# Patient Record
Sex: Female | Born: 2000 | Race: White | Hispanic: No | Marital: Single | Smoking: Never smoker
Health system: Southern US, Community
[De-identification: ages and names within clinical notes are randomized; demographics above are authoritative.]

## PROBLEM LIST (undated history)

## (undated) DIAGNOSIS — K2970 Gastritis, unspecified, without bleeding: Secondary | ICD-10-CM

## (undated) HISTORY — PX: ADENOIDECTOMY: SUR15

## (undated) HISTORY — PX: UPPER GASTROINTESTINAL ENDOSCOPY: SHX188

## (undated) HISTORY — PX: WISDOM TOOTH EXTRACTION: SHX21

## (undated) HISTORY — PX: TONSILLECTOMY: SUR1361

---
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
## 2020-06-09 NOTE — L&D Delivery Note (Signed)
Delivery Note Patient was admitted yesterday with early labor and new onset gestational hypertension.  She progressed without augmentation to 8 cm.  She developed chorioamnionitis and was started on ampicillin and gentamicin. She had a protracted active phase overnight.  This morning, an IUPC was placed and pitocin started.  She then progressed to 10 cm.  She pushed well for 1.5 hours.  At 12:51 PM a viable female was delivered via Vaginal, Spontaneous (Presentation: Right Occiput Anterior).  APGAR: 7, 8; weight 3270 gm (7lb 3.3oz) .   Placenta status: Spontaneous, Intact.  Cord: 3 vessels with the following complications: None.  Cord pH: n/a  Anesthesia: Epidural Episiotomy: None Lacerations: Vaginal, bilateral.  Both extended slightly to labia majora Suture Repair: 2.0 vicryl rapide Est. Blood Loss (mL): 100  Mom to postpartum.  Baby to Couplet care / Skin to Skin.  Enid Maultsby GEFFEL Charline Hoskinson 03/08/2021, 1:38 PM

## 2020-08-08 LAB — OB RESULTS CONSOLE GC/CHLAMYDIA
Chlamydia: NEGATIVE
Gonorrhea: NEGATIVE

## 2020-08-08 LAB — OB RESULTS CONSOLE ABO/RH: RH Type: POSITIVE

## 2020-08-08 LAB — OB RESULTS CONSOLE RPR: RPR: NONREACTIVE

## 2020-08-08 LAB — OB RESULTS CONSOLE RUBELLA ANTIBODY, IGM: Rubella: NON-IMMUNE/NOT IMMUNE

## 2020-08-08 LAB — OB RESULTS CONSOLE HIV ANTIBODY (ROUTINE TESTING): HIV: NONREACTIVE

## 2020-08-08 LAB — OB RESULTS CONSOLE ANTIBODY SCREEN: Antibody Screen: NEGATIVE

## 2020-08-08 LAB — OB RESULTS CONSOLE HEPATITIS B SURFACE ANTIGEN: Hepatitis B Surface Ag: NEGATIVE

## 2020-08-16 ENCOUNTER — Other Ambulatory Visit: Payer: Self-pay

## 2020-09-06 ENCOUNTER — Other Ambulatory Visit: Payer: Self-pay | Admitting: Obstetrics and Gynecology

## 2020-09-06 DIAGNOSIS — Z363 Encounter for antenatal screening for malformations: Secondary | ICD-10-CM

## 2020-10-03 ENCOUNTER — Encounter: Payer: Self-pay | Admitting: *Deleted

## 2020-10-08 ENCOUNTER — Other Ambulatory Visit: Payer: Self-pay

## 2020-10-08 ENCOUNTER — Ambulatory Visit: Payer: BC Managed Care – PPO | Attending: Obstetrics and Gynecology

## 2020-10-08 DIAGNOSIS — K2970 Gastritis, unspecified, without bleeding: Secondary | ICD-10-CM

## 2020-10-08 DIAGNOSIS — O99342 Other mental disorders complicating pregnancy, second trimester: Secondary | ICD-10-CM | POA: Diagnosis not present

## 2020-10-08 DIAGNOSIS — F419 Anxiety disorder, unspecified: Secondary | ICD-10-CM | POA: Diagnosis not present

## 2020-10-08 DIAGNOSIS — O99612 Diseases of the digestive system complicating pregnancy, second trimester: Secondary | ICD-10-CM

## 2020-10-08 DIAGNOSIS — Z363 Encounter for antenatal screening for malformations: Secondary | ICD-10-CM | POA: Diagnosis present

## 2020-10-08 DIAGNOSIS — O321XX0 Maternal care for breech presentation, not applicable or unspecified: Secondary | ICD-10-CM

## 2020-10-08 DIAGNOSIS — Z3A19 19 weeks gestation of pregnancy: Secondary | ICD-10-CM

## 2021-02-21 ENCOUNTER — Telehealth (HOSPITAL_COMMUNITY): Payer: Self-pay | Admitting: *Deleted

## 2021-02-21 NOTE — Telephone Encounter (Signed)
Preadmission screen  

## 2021-02-22 ENCOUNTER — Encounter (HOSPITAL_COMMUNITY): Payer: Self-pay | Admitting: *Deleted

## 2021-02-22 ENCOUNTER — Telehealth (HOSPITAL_COMMUNITY): Payer: Self-pay | Admitting: *Deleted

## 2021-02-22 NOTE — Telephone Encounter (Signed)
Preadmission screen  

## 2021-03-05 ENCOUNTER — Other Ambulatory Visit: Payer: Self-pay | Admitting: Obstetrics and Gynecology

## 2021-03-06 LAB — SARS CORONAVIRUS 2 (TAT 6-24 HRS): SARS Coronavirus 2: NEGATIVE

## 2021-03-07 ENCOUNTER — Inpatient Hospital Stay (HOSPITAL_COMMUNITY): Payer: Medicaid Other

## 2021-03-07 ENCOUNTER — Encounter (HOSPITAL_COMMUNITY): Payer: Self-pay | Admitting: Obstetrics and Gynecology

## 2021-03-07 ENCOUNTER — Inpatient Hospital Stay (HOSPITAL_COMMUNITY)
Admission: AD | Admit: 2021-03-07 | Payer: Medicaid Other | Source: Home / Self Care | Admitting: Obstetrics and Gynecology

## 2021-03-07 ENCOUNTER — Inpatient Hospital Stay (HOSPITAL_COMMUNITY): Payer: Medicaid Other | Admitting: Anesthesiology

## 2021-03-07 ENCOUNTER — Inpatient Hospital Stay (HOSPITAL_COMMUNITY)
Admission: AD | Admit: 2021-03-07 | Discharge: 2021-03-10 | DRG: 805 | Disposition: A | Discharge status: Home / Self Care | Payer: Medicaid Other | Attending: Obstetrics | Admitting: Obstetrics

## 2021-03-07 ENCOUNTER — Other Ambulatory Visit: Payer: Self-pay

## 2021-03-07 DIAGNOSIS — O411230 Chorioamnionitis, third trimester, not applicable or unspecified: Secondary | ICD-10-CM | POA: Diagnosis present

## 2021-03-07 DIAGNOSIS — O99892 Other specified diseases and conditions complicating childbirth: Secondary | ICD-10-CM | POA: Diagnosis present

## 2021-03-07 DIAGNOSIS — R000 Tachycardia, unspecified: Secondary | ICD-10-CM | POA: Diagnosis present

## 2021-03-07 DIAGNOSIS — Z3A40 40 weeks gestation of pregnancy: Secondary | ICD-10-CM | POA: Diagnosis not present

## 2021-03-07 DIAGNOSIS — O134 Gestational [pregnancy-induced] hypertension without significant proteinuria, complicating childbirth: Secondary | ICD-10-CM | POA: Diagnosis present

## 2021-03-07 HISTORY — DX: Gastritis, unspecified, without bleeding: K29.70

## 2021-03-07 LAB — CBC
HCT: 39.3 % (ref 36.0–46.0)
Hemoglobin: 13.7 g/dL (ref 12.0–15.0)
MCH: 29.5 pg (ref 26.0–34.0)
MCHC: 34.9 g/dL (ref 30.0–36.0)
MCV: 84.5 fL (ref 80.0–100.0)
Platelets: 207 10*3/uL (ref 150–400)
RBC: 4.65 MIL/uL (ref 3.87–5.11)
RDW: 12.8 % (ref 11.5–15.5)
WBC: 15.1 10*3/uL — ABNORMAL HIGH (ref 4.0–10.5)
nRBC: 0 % (ref 0.0–0.2)

## 2021-03-07 LAB — COMPREHENSIVE METABOLIC PANEL
ALT: 11 U/L (ref 0–44)
AST: 18 U/L (ref 15–41)
Albumin: 2.9 g/dL — ABNORMAL LOW (ref 3.5–5.0)
Alkaline Phosphatase: 116 U/L (ref 38–126)
Anion gap: 12 (ref 5–15)
BUN: 7 mg/dL (ref 6–20)
CO2: 16 mmol/L — ABNORMAL LOW (ref 22–32)
Calcium: 8.7 mg/dL — ABNORMAL LOW (ref 8.9–10.3)
Chloride: 108 mmol/L (ref 98–111)
Creatinine, Ser: 0.72 mg/dL (ref 0.44–1.00)
GFR, Estimated: >60 mL/min (ref >60)
Glucose, Bld: 98 mg/dL (ref 70–99)
Potassium: 3.6 mmol/L (ref 3.5–5.1)
Sodium: 136 mmol/L (ref 135–145)
Total Bilirubin: 0.9 mg/dL (ref 0.3–1.2)
Total Protein: 6 g/dL — ABNORMAL LOW (ref 6.5–8.1)

## 2021-03-07 LAB — TYPE AND SCREEN
ABO/RH(D): A POS
Antibody Screen: NEGATIVE

## 2021-03-07 LAB — PROTEIN / CREATININE RATIO, URINE
Creatinine, Urine: 117.8 mg/dL
Protein Creatinine Ratio: 0.12 mg/mg{Cre} (ref 0.00–0.15)
Total Protein, Urine: 14 mg/dL

## 2021-03-07 MED ORDER — LACTATED RINGERS IV SOLN: 500.0000 mL | INTRAVENOUS | Status: DC | PRN

## 2021-03-07 MED ORDER — PHENYLEPHRINE 40 MCG/ML (10ML) SYRINGE FOR IV PUSH (FOR BLOOD PRESSURE SUPPORT): 80.0000 ug | PREFILLED_SYRINGE | INTRAVENOUS | Status: DC | PRN

## 2021-03-07 MED ORDER — FENTANYL CITRATE (PF) 100 MCG/2ML IJ SOLN
50.0000 ug | INTRAMUSCULAR | Status: DC | PRN
  Administered 2021-03-07: 50 ug via INTRAVENOUS
  Filled 2021-03-07: qty 2

## 2021-03-07 MED ORDER — OXYTOCIN-SODIUM CHLORIDE 30-0.9 UT/500ML-% IV SOLN
2.5000 [IU]/h | INTRAVENOUS | Status: DC
  Administered 2021-03-08: 2.5 [IU]/h via INTRAVENOUS

## 2021-03-07 MED ORDER — OXYTOCIN BOLUS FROM INFUSION
333.0000 mL | Freq: Once | INTRAVENOUS | Status: AC
  Administered 2021-03-08: 333 mL via INTRAVENOUS

## 2021-03-07 MED ORDER — SOD CITRATE-CITRIC ACID 500-334 MG/5ML PO SOLN: 30.0000 mL | ORAL | Status: DC | PRN

## 2021-03-07 MED ORDER — LIDOCAINE HCL (PF) 1 % IJ SOLN: 30.0000 mL | INTRAMUSCULAR | Status: DC | PRN

## 2021-03-07 MED ORDER — LACTATED RINGERS IV SOLN: 500.0000 mL | Freq: Once | INTRAVENOUS | Status: DC

## 2021-03-07 MED ORDER — ACETAMINOPHEN 325 MG PO TABS
650.0000 mg | ORAL_TABLET | ORAL | Status: DC | PRN
  Administered 2021-03-08 (×3): 650 mg via ORAL
  Filled 2021-03-07 (×3): qty 2

## 2021-03-07 MED ORDER — OXYCODONE-ACETAMINOPHEN 5-325 MG PO TABS: 2.0000 | ORAL_TABLET | ORAL | Status: DC | PRN

## 2021-03-07 MED ORDER — DIPHENHYDRAMINE HCL 50 MG/ML IJ SOLN
12.5000 mg | INTRAMUSCULAR | Status: DC | PRN
Start: 2021-03-07 — End: 2021-03-08

## 2021-03-07 MED ORDER — LACTATED RINGERS IV SOLN: INTRAVENOUS | Status: DC

## 2021-03-07 MED ORDER — OXYCODONE-ACETAMINOPHEN 5-325 MG PO TABS: 1.0000 | ORAL_TABLET | ORAL | Status: DC | PRN

## 2021-03-07 MED ORDER — ONDANSETRON HCL 4 MG/2ML IJ SOLN
4.0000 mg | Freq: Four times a day (QID) | INTRAMUSCULAR | Status: DC | PRN
  Administered 2021-03-08: 4 mg via INTRAVENOUS
  Filled 2021-03-07: qty 2

## 2021-03-07 MED ORDER — TERBUTALINE SULFATE 1 MG/ML IJ SOLN: 0.2500 mg | Freq: Once | INTRAMUSCULAR | Status: DC | PRN

## 2021-03-07 MED ORDER — LIDOCAINE-EPINEPHRINE (PF) 2 %-1:200000 IJ SOLN
INTRAMUSCULAR | Status: DC | PRN
  Administered 2021-03-07: 5 mL via EPIDURAL

## 2021-03-07 MED ORDER — EPHEDRINE 5 MG/ML INJ: 10.0000 mg | INTRAVENOUS | Status: DC | PRN

## 2021-03-07 MED ORDER — OXYTOCIN-SODIUM CHLORIDE 30-0.9 UT/500ML-% IV SOLN
1.0000 m[IU]/min | INTRAVENOUS | Status: DC
  Administered 2021-03-08: 2 m[IU]/min via INTRAVENOUS
  Filled 2021-03-07: qty 500

## 2021-03-07 MED ORDER — FENTANYL-BUPIVACAINE-NACL 0.5-0.125-0.9 MG/250ML-% EP SOLN
12.0000 mL/h | EPIDURAL | Status: DC | PRN
  Administered 2021-03-07 – 2021-03-08 (×2): 12 mL/h via EPIDURAL
  Filled 2021-03-07 (×2): qty 250

## 2021-03-07 NOTE — H&P (Signed)
Katelyn Mcguire is a 20 y.o. female G1P0 [redacted]w[redacted]d, was scheduled for late term induction today however came into MAU with contractions, blood show, and elevated blood pressures earlier today. She reports good fetal movement and no leakage of fluid.   In MAU, exam 3/70/-2 (same as office) but Bps noted to elevated 136/96, 140/103/ 134/79, 133/92. She did have one elevated diastolic of 90 at around 25 weeks but otherwise has had normal blood pressures throughout the pregnancy. She denies headache, vision changes, RUQ pain.   Pregnancy has been otherwise uncomplicated.   OB History     Gravida  1   Para      Term      Preterm      AB      Living         SAB      IAB      Ectopic      Multiple      Live Births             Past Medical History:  Diagnosis Date   Gastritis    Past Surgical History:  Procedure Laterality Date   ADENOIDECTOMY     TONSILLECTOMY     UPPER GASTROINTESTINAL ENDOSCOPY     WISDOM TOOTH EXTRACTION     Family History: family history includes Breast cancer in her maternal grandmother. Social History:  reports that she has never smoked. She has never used smokeless tobacco. She reports that she does not drink alcohol and does not use drugs.     Maternal Diabetes: No Genetic Screening: Normal Maternal Ultrasounds/Referrals: Normal Fetal Ultrasounds or other Referrals:  None Maternal Substance Abuse:  No Significant Maternal Medications:  None Significant Maternal Lab Results:  Group B Strep negative Other Comments:  None  Review of Systems Per HPI Exam Physical Exam  Dilation: 4 Effacement (%): 80 Station: -2 Exam by:: Dr. Timothy Lasso Blood pressure 130/82, pulse 84, temperature 98.8 F (37.1 C), temperature source Axillary, resp. rate 18, height 5\' 4"  (1.626 m), weight 87.1 kg, last menstrual period 05/26/2020, SpO2 96 %. Gen: NAD, resting comfortably CVS: RRR Lungs; CTAB Abd:  Gravid abdomen, Leopolds 7# Ext: no calf edema or  tenderness  SVE 4/80/-2 Fetal testing: FHR 130bpm, moderate variability, + accels, no decels Ctx q 2-4 mins Prenatal labs: ABO, Rh:  --/--/A POS (09/29 1237) Antibody: NEG (09/29 1237) Rubella: Nonimmune (03/02 0000) RPR: Nonreactive (03/02 0000)  HBsAg: Negative (03/02 0000)  HIV: Non-reactive (03/02 0000)  GBS:   negative  Assessment/Plan: 20Y G1P0 @ [redacted]w[redacted]d, early labor, new diagnosis gestational hypertension Fetal wellbeing: cat I tracing Early labor: currently progressing on her own from 3 to 4cm with regular contractions. Discussed augmentation if contractions space with pitocin and/or AROM. Expectant management for now Gestational hypertension: new diagnosis by mild range BPS, normal plts and CMP, urine P/C 0.12. No signs/symptoms of severe preeclampsia.  Pain control: epidural upon patient request  [redacted]w[redacted]d 03/07/2021, 4:08 PM

## 2021-03-07 NOTE — Anesthesia Preprocedure Evaluation (Addendum)
Anesthesia Evaluation  Patient identified by MRN, date of birth, ID band Patient awake    Reviewed: Allergy & Precautions, NPO status , Patient's Chart, lab work & pertinent test results  Airway Mallampati: II  TM Distance: >3 FB Neck ROM: Full    Dental no notable dental hx.    Pulmonary neg pulmonary ROS,    Pulmonary exam normal breath sounds clear to auscultation       Cardiovascular hypertension (gHTN), Normal cardiovascular exam Rhythm:Regular Rate:Normal     Neuro/Psych negative neurological ROS  negative psych ROS   GI/Hepatic negative GI ROS, Neg liver ROS,   Endo/Other  negative endocrine ROS  Renal/GU negative Renal ROS  negative genitourinary   Musculoskeletal negative musculoskeletal ROS (+)   Abdominal (+) + obese,   Peds  Hematology negative hematology ROS (+)   Anesthesia Other Findings Presented in labor  Reproductive/Obstetrics (+) Pregnancy                            Anesthesia Physical Anesthesia Plan  ASA: 3  Anesthesia Plan: Epidural   Post-op Pain Management:    Induction:   PONV Risk Score and Plan: Treatment may vary due to age or medical condition  Airway Management Planned: Natural Airway  Additional Equipment:   Intra-op Plan:   Post-operative Plan:   Informed Consent: I have reviewed the patients History and Physical, chart, labs and discussed the procedure including the risks, benefits and alternatives for the proposed anesthesia with the patient or authorized representative who has indicated his/her understanding and acceptance.       Plan Discussed with: Anesthesiologist  Anesthesia Plan Comments: (Patient identified. Risks, benefits, options discussed with patient including but not limited to bleeding, infection, nerve damage, paralysis, failed block, incomplete pain control, headache, blood pressure changes, nausea, vomiting, reactions to  medication, itching, and post partum back pain. Confirmed with bedside nurse the patient's most recent platelet count. Confirmed with the patient that they are not taking any anticoagulation, have any bleeding history or any family history of bleeding disorders. Patient expressed understanding and wishes to proceed. All questions were answered. )        Anesthesia Quick Evaluation

## 2021-03-07 NOTE — Anesthesia Procedure Notes (Signed)
Epidural Patient location during procedure: OB Start time: 03/07/2021 4:45 PM End time: 03/07/2021 4:55 PM  Staffing Anesthesiologist: Elmer Picker, MD Performed: anesthesiologist   Preanesthetic Checklist Completed: patient identified, IV checked, risks and benefits discussed, monitors and equipment checked, pre-op evaluation and timeout performed  Epidural Patient position: sitting Prep: DuraPrep and site prepped and draped Patient monitoring: continuous pulse ox, blood pressure, heart rate and cardiac monitor Approach: midline Location: L3-L4 Injection technique: LOR air  Needle:  Needle type: Tuohy  Needle gauge: 17 G Needle length: 9 cm Needle insertion depth: 5 cm Catheter type: closed end flexible Catheter size: 19 Gauge Catheter at skin depth: 10 cm Test dose: negative  Assessment Sensory level: T8 Events: blood not aspirated, injection not painful, no injection resistance, no paresthesia and negative IV test  Additional Notes Patient identified. Risks/Benefits/Options discussed with patient including but not limited to bleeding, infection, nerve damage, paralysis, failed block, incomplete pain control, headache, blood pressure changes, nausea, vomiting, reactions to medication both or allergic, itching and postpartum back pain. Confirmed with bedside nurse the patient's most recent platelet count. Confirmed with patient that they are not currently taking any anticoagulation, have any bleeding history or any family history of bleeding disorders. Patient expressed understanding and wished to proceed. All questions were answered. Sterile technique was used throughout the entire procedure. Please see nursing notes for vital signs. Test dose was given through epidural catheter and negative prior to continuing to dose epidural or start infusion. Warning signs of high block given to the patient including shortness of breath, tingling/numbness in hands, complete motor block,  or any concerning symptoms with instructions to call for help. Patient was given instructions on fall risk and not to get out of bed. All questions and concerns addressed with instructions to call with any issues or inadequate analgesia.  Reason for block:procedure for pain

## 2021-03-07 NOTE — Progress Notes (Signed)
OB Progress Note  S: patient comfortable with epidural   O: Today's Vitals   03/07/21 1806 03/07/21 1811 03/07/21 1831 03/07/21 1902  BP: 125/83 133/88 132/85 101/66  Pulse: (!) 107 (!) 118 98 (!) 112  Resp: 18 18 18 18   Temp:    98.7 F (37.1 C)  TempSrc:      SpO2:      Weight:      Height:      PainSc:    0-No pain   Body mass index is 32.96 kg/m.  SVE 5/80/-1, AROM blood tinged fluid  FHR: 135bpm, moderate variability,  + accels,no decels Toco: ctx q 2-3 min   A/P: 20Y G1P0 @ [redacted]w[redacted]d, early labor, new diagnosis gestational hypertension Fetal wellbeing: cat I tracing Early labor: s/p AROM, progressing well, continue expectant management.  Gestational hypertension: new diagnosis by mild range BPS, normal plts and CMP, urine P/C 0.12. No signs/symptoms of severe preeclampsia.  Pain control: epidural  M. [redacted]w[redacted]d, MD 03/07/21 7:51 PM

## 2021-03-07 NOTE — MAU Note (Signed)
.  Katelyn Mcguire is a 20 y.o. at [redacted]w[redacted]d here in MAU reporting: ctx that started around 0930 this morning. Reports she is having some bloody show. Denies LOF. Endorses good fetal movement. For induction today. GBS neg.  Pain score: 4

## 2021-03-08 ENCOUNTER — Encounter (HOSPITAL_COMMUNITY): Payer: Self-pay | Admitting: Obstetrics and Gynecology

## 2021-03-08 LAB — RPR: RPR Ser Ql: NONREACTIVE

## 2021-03-08 MED ORDER — IBUPROFEN 600 MG PO TABS
600.0000 mg | ORAL_TABLET | Freq: Four times a day (QID) | ORAL | Status: DC
  Administered 2021-03-08 – 2021-03-10 (×8): 600 mg via ORAL
  Filled 2021-03-08 (×8): qty 1

## 2021-03-08 MED ORDER — ONDANSETRON HCL 4 MG/2ML IJ SOLN: 4.0000 mg | INTRAMUSCULAR | Status: DC | PRN

## 2021-03-08 MED ORDER — ACETAMINOPHEN 325 MG PO TABS: 650.0000 mg | ORAL_TABLET | ORAL | Status: DC | PRN

## 2021-03-08 MED ORDER — OXYCODONE HCL 5 MG PO TABS: 5.0000 mg | ORAL_TABLET | ORAL | Status: DC | PRN

## 2021-03-08 MED ORDER — WITCH HAZEL-GLYCERIN EX PADS
1.0000 "application " | MEDICATED_PAD | CUTANEOUS | Status: DC | PRN
  Administered 2021-03-08: 1 via TOPICAL

## 2021-03-08 MED ORDER — SENNOSIDES-DOCUSATE SODIUM 8.6-50 MG PO TABS
2.0000 | ORAL_TABLET | ORAL | Status: DC
  Administered 2021-03-09 – 2021-03-10 (×2): 2 via ORAL
  Filled 2021-03-08 (×2): qty 2

## 2021-03-08 MED ORDER — DIPHENHYDRAMINE HCL 25 MG PO CAPS: 25.0000 mg | ORAL_CAPSULE | Freq: Four times a day (QID) | ORAL | Status: DC | PRN

## 2021-03-08 MED ORDER — SODIUM CHLORIDE 0.9 % IV SOLN
2.0000 g | Freq: Four times a day (QID) | INTRAVENOUS | Status: DC
  Administered 2021-03-08 (×2): 2 g via INTRAVENOUS
  Filled 2021-03-08 (×2): qty 2000

## 2021-03-08 MED ORDER — ONDANSETRON HCL 4 MG PO TABS: 4.0000 mg | ORAL_TABLET | ORAL | Status: DC | PRN

## 2021-03-08 MED ORDER — GENTAMICIN SULFATE 40 MG/ML IJ SOLN
5.0000 mg/kg | INTRAVENOUS | Status: DC
  Administered 2021-03-08: 440 mg via INTRAVENOUS
  Filled 2021-03-08 (×2): qty 11

## 2021-03-08 MED ORDER — TETANUS-DIPHTH-ACELL PERTUSSIS 5-2.5-18.5 LF-MCG/0.5 IM SUSY: 0.5000 mL | PREFILLED_SYRINGE | Freq: Once | INTRAMUSCULAR | Status: DC

## 2021-03-08 MED ORDER — PRENATAL MULTIVITAMIN CH
1.0000 | ORAL_TABLET | Freq: Every day | ORAL | Status: DC
  Administered 2021-03-09 – 2021-03-10 (×2): 1 via ORAL
  Filled 2021-03-08 (×2): qty 1

## 2021-03-08 MED ORDER — COCONUT OIL OIL: 1.0000 "application " | TOPICAL_OIL | Status: DC | PRN

## 2021-03-08 MED ORDER — SIMETHICONE 80 MG PO CHEW: 80.0000 mg | CHEWABLE_TABLET | ORAL | Status: DC | PRN

## 2021-03-08 MED ORDER — OXYCODONE HCL 5 MG PO TABS: 10.0000 mg | ORAL_TABLET | ORAL | Status: DC | PRN

## 2021-03-08 MED ORDER — DIBUCAINE (PERIANAL) 1 % EX OINT: 1.0000 "application " | TOPICAL_OINTMENT | CUTANEOUS | Status: DC | PRN

## 2021-03-08 MED ORDER — BENZOCAINE-MENTHOL 20-0.5 % EX AERO
1.0000 "application " | INHALATION_SPRAY | CUTANEOUS | Status: DC | PRN
  Administered 2021-03-08 – 2021-03-10 (×2): 1 via TOPICAL
  Filled 2021-03-08 (×2): qty 56

## 2021-03-08 NOTE — Progress Notes (Signed)
OB Progress Note  Notified by RN of T 100.40F Temp at 0157 100.34F after Tylenol given for headache   O: Today's Vitals   03/08/21 0231 03/08/21 0301 03/08/21 0329 03/08/21 0332  BP: (!) 136/98 (!) 137/93  120/75  Pulse: (!) 118 (!) 116  (!) 122  Resp:      Temp:   (!) 100.9 F (38.3 C)   TempSrc:   Axillary   SpO2:      Weight:      Height:      PainSc:       Body mass index is 32.96 kg/m.  Last exam by RN at 0151: 8/90/0  FHR: 155bpm, minimal variability, absent accels, early decels Toco: ctx q 2 mins   A/P: A/P:  20Y G1P0 @ [redacted]w[redacted]d, labor, new diagnosis gestational hypertension Fetal wellbeing: cat 2 tracing, continue position change PRN and IV fluids Active labor: expectant management Gestational hypertension: new diagnosis by mild range BPS, normal plts and CMP, urine P/C 0.12. No signs/symptoms of severe preeclampsia.  Pain control: epidural Febrile, with maternal tachycardia: given last temp was 100.34F after taking Tylenol, will treat as chorioamnionitis now and start amp/gent.   Alinda Deem, MD 03/08/21 3:39 AM

## 2021-03-08 NOTE — Lactation Note (Addendum)
This note was copied from a baby's chart. Lactation Consultation Note  Patient Name: Katelyn Mcguire FIEPP'I Date: 03/08/2021 Reason for consult: Initial assessment;Mother's request;Difficult latch;Primapara;1st time breastfeeding;Term Age: 22 hrs  Mom struggling trying to latch infant throughout the day. LC worked on suck training to bring his tongue down and flange out his lips. Infant latched with increase in depth of swallows after suck training.   Plan 1. To feed based on cues 8-12x 24 hr period. Mom to offer breasts and look for signs of milk transfer.  2. If unable to latch, Mom to hand express and give colostrum on spoon.  3. Mom wearing breast shells not pumping, sleeping or nursing. (Areola edema) 4. Mom manual pump post pump after latching 10 min each breast. 5 I and O sheet reviewed.  6. LC brochure of inpatient and outpatient services reviewed.   Mom stated 24 flange better fit. Mom aware nipples can swell and will alert the RN if pain or discomfort develops.   LC alerted night RN, Mom will need assistance.  Mom to call for latch assistance.   All questions answered at the end of the visit.   Maternal Data Has patient been taught Hand Expression?: Yes Does the patient have breastfeeding experience prior to this delivery?: No  Feeding Mother's Current Feeding Choice: Breast Milk  LATCH Score Latch: Repeated attempts needed to sustain latch, nipple held in mouth throughout feeding, stimulation needed to elicit sucking reflex.  Audible Swallowing: A few with stimulation  Type of Nipple: Everted at rest and after stimulation  Comfort (Breast/Nipple): Soft / non-tender  Hold (Positioning): Assistance needed to correctly position infant at breast and maintain latch.  LATCH Score: 7   Lactation Tools Discussed/Used Tools: Pump;Flanges;Shells Flange Size: 24 Breast pump type: Manual Pump Education: Setup, frequency, and cleaning;Milk Storage Reason for Pumping:  increase stimulation Pumping frequency: every 3 hrs for 10 min each breast  Interventions Interventions: Breast feeding basics reviewed;Assisted with latch;Skin to skin;Breast massage;Hand express;Breast compression;Adjust position;Support pillows;Position options;Expressed milk;Shells;Hand pump;Education;LC Services brochure  Discharge    Consult Status Consult Status: Follow-up Date: 03/09/21 Follow-up type: In-patient    Cheral Cappucci  Nicholson-Springer 03/08/2021, 8:57 PM

## 2021-03-08 NOTE — Progress Notes (Signed)
   BP (!) 106/56   Pulse (!) 111   Temp 99.6 F (37.6 C) (Oral)   Resp 16   Ht 5\' 4"  (1.626 m)   Wt 87.1 kg   LMP 05/26/2020   SpO2 100%   BMI 32.96 kg/m   TOco: q2-3 minutes, MVUs 130 EFM: 135, moderate variability, occ early decelerations, category 1 SVE: 9.5/90/0 per RN  Plan:  Cervical change despite inadequate MVUs.  Continue to titrate pitocin as needed Chorioamnionitis: continue ampicllin / gentamicin, remains afebrile

## 2021-03-08 NOTE — Progress Notes (Signed)
OB Progress Note  Patient seen and examined after oncoming nurse reports that she felt SVE only 8 cm.     O: Today's Vitals   03/08/21 0701 03/08/21 0731 03/08/21 0816 03/08/21 0831  BP: 133/89 139/88  123/75  Pulse: (!) 131 (!) 126  (!) 104  Resp: 18  16   Temp: 99.6 F (37.6 C)   99.6 F (37.6 C)  TempSrc: Axillary   Oral  SpO2:   100%   Weight:      Height:      PainSc:   0-No pain 0-No pain     FHR: 145s, periods of moderate and periods of minimal variability, early decerations Toco: ctx q 2 mins SVE 8/80/0, IUPC placed   A/P: A/P:  20Y G1P0 @ [redacted]w[redacted]d, labor, new diagnosis gestational hypertension Prolonged active labor--IUPC placed and contractions inadequate.  Will start pitocin now.  Discussed with patient prolonged active phase and potential reasons.  Will monitor closely Gestational hypertension: new diagnosis by mild range BPS, normal plts and CMP, urine P/C 0.12. No signs/symptoms of severe preeclampsia.  Pain control: epidural Febrile, with maternal tachycardia: diagnosed as chorioamnionitis overnight and on amp/gent. Currently afebrile

## 2021-03-08 NOTE — Lactation Note (Signed)
This note was copied from a baby's chart. Lactation Consultation Note  Patient Name: Boy Roiza Wiedel [id number] Date: 03/08/2021 Reason for consult: L&D Initial assessment Age:22 hours P1, infant STS when LC arrived to L&D .Marland Kitchen infant placed in football hold and latched on and off for several mins. Infant placed in prone position for deeper latch. Infant sustained latch for 10-15 mins. Observed swallows. Mother reports feeling strong tugging.  Encouraged cue base feeding. Informed mother that she would have assistance when she goes to her room. Advised to continue to do frequent STS.   Maternal Data Has patient been taught Hand Expression?: Yes Does the patient have breastfeeding experience prior to this delivery?: No  Feeding Mother's Current Feeding Choice: Breast Milk  LATCH Score Latch: Repeated attempts needed to sustain latch, nipple held in mouth throughout feeding, stimulation needed to elicit sucking reflex.  Audible Swallowing: Spontaneous and intermittent  Type of Nipple: Everted at rest and after stimulation  Comfort (Breast/Nipple): Soft / non-tender  Hold (Positioning): Assistance needed to correctly position infant at breast and maintain latch.  LATCH Score: 8   Lactation Tools Discussed/Used    Interventions Interventions: Breast feeding basics reviewed;Assisted with latch;Skin to skin;Hand express;Breast compression;Adjust position;Support pillows;Position options;Education  Discharge    Consult Status Consult Status: Follow-up from L&D    Stevan Born [facility] 03/08/2021, 2:11 PM

## 2021-03-08 NOTE — Progress Notes (Signed)
OB Progress Note  S: Patient feeling shivers, vaginal pressure   O: Today's Vitals   03/07/21 2201 03/07/21 2231 03/07/21 2301 03/07/21 2331  BP:   105/71 100/62  Pulse:   (!) 129 (!) 136  Resp:      Temp:  98.7 F (37.1 C)    TempSrc:  Oral    SpO2:      Weight:      Height:      PainSc: 0-No pain      Body mass index is 32.96 kg/m. HR ranging 110-130  SVE 7/90/0, ROA, bloody show  FHR: 150bpm, minimal to moderate variability, no accels, small early decels Toco: ctx q 2 mins   A/P:  20Y G1P0 @ [redacted]w[redacted]d, labor, new diagnosis gestational hypertension Fetal wellbeing: cat 1-2 tracing Active labor: expectant management Gestational hypertension: new diagnosis by mild range BPS, normal plts and CMP, urine P/C 0.12. No signs/symptoms of severe preeclampsia.  Pain control: epidural Tachycardia: afebrile, Bps low for her, patient currently shivering likely due to transition into active phase of labor, she is asymptomatic. Will bolus IV fluids and continue to monitor. If no improvement or symptoms can get EKG.   Alinda Deem, MD 03/08/21 12:32 AM

## 2021-03-09 LAB — CBC
HCT: 36.6 % (ref 36.0–46.0)
Hemoglobin: 12.5 g/dL (ref 12.0–15.0)
MCH: 29.3 pg (ref 26.0–34.0)
MCHC: 34.2 g/dL (ref 30.0–36.0)
MCV: 85.9 fL (ref 80.0–100.0)
Platelets: 165 10*3/uL (ref 150–400)
RBC: 4.26 MIL/uL (ref 3.87–5.11)
RDW: 13.2 % (ref 11.5–15.5)
WBC: 24.6 10*3/uL — ABNORMAL HIGH (ref 4.0–10.5)
nRBC: 0 % (ref 0.0–0.2)

## 2021-03-09 NOTE — Lactation Note (Signed)
This note was copied from a baby's chart. Lactation Consultation Note  Patient Name: Katelyn Mcguire [id number] Date: 03/09/2021 Reason for consult: Follow-up assessment;Term;Primapara;1st time breastfeeding Age:22 hours   P1 mother whose infant is now 44 hours old.  This is a term baby at 40+6 weeks.    Baby was in the nursery receiving a circumcision when I arrived.  Mother reported that he has been feeding much better since approximately 0300 this morning.  Mother has learned how to latch deeply and denies pain with feeding.  Discussed the possibility of sleepiness following circumcision and cluster feeding tonight.  Mother verbalized understanding.  Encouraged her to call her RN/LC for latch assistance as needed.  Mother had some basic questions which I answered to her satisfaction.  Discussed pacifier use and bottle feeding as desired.  No support person present at this time.  Mother has a DEBP for home use.   Maternal Data Has patient been taught Hand Expression?: Yes Does the patient have breastfeeding experience prior to this delivery?: No  Feeding Mother's Current Feeding Choice: Breast Milk  LATCH Score                    Lactation Tools Discussed/Used    Interventions Interventions: Education  Discharge    Consult Status Consult Status: Follow-up Date: 03/10/21 Follow-up type: In-patient    Dora Sims 03/09/2021, 12:26 PM

## 2021-03-09 NOTE — Progress Notes (Signed)
PPD #1 No problems Afeb, VSS Fundus firm, NT at U-1 Continue routine postpartum care 

## 2021-03-09 NOTE — Anesthesia Postprocedure Evaluation (Signed)
Anesthesia Post Note  Patient: Katelyn Mcguire  Procedure(s) Performed: AN AD HOC LABOR EPIDURAL     Patient location during evaluation: Mother Baby Anesthesia Type: Epidural Level of consciousness: awake and alert Pain management: pain level controlled Vital Signs Assessment: post-procedure vital signs reviewed and stable Respiratory status: spontaneous breathing, nonlabored ventilation and respiratory function stable Cardiovascular status: stable Postop Assessment: no headache, no backache and epidural receding Anesthetic complications: no   No notable events documented.  Last Vitals:  Vitals:   03/09/21 0027 03/09/21 0505  BP: 127/84 117/79  Pulse: 80 81  Resp: 17 18  Temp: 36.8 C 37 C  SpO2: 100% 100%    Last Pain:  Vitals:   03/09/21 0815  TempSrc:   PainSc: Asleep   Pain Goal:                   Mauricia Area

## 2021-03-10 MED ORDER — IBUPROFEN 600 MG PO TABS: 600.0000 mg | ORAL_TABLET | Freq: Four times a day (QID) | ORAL | 0 refills | Status: AC

## 2021-03-10 NOTE — Discharge Instructions (Signed)
As per discharge pamphlet °

## 2021-03-10 NOTE — Lactation Note (Signed)
This note was copied from a baby's chart. Lactation Consultation Note  Patient Name: Katelyn Mcguire [id number] Date: 03/10/2021 Reason for consult: Follow-up assessment;Primapara;Term;1st time breastfeeding Age:22 hours   P1 mother whose infant is now 53 hours old.  This is a term baby at 40+6 weeks.    RN in room when I arrived.  Spoke with mother about how her breast feeding has been going and she started crying.  Mother feels like multiple people have made her feel like she is not doing a good job.  Emotional support provided.  Mother feels like she can latch and feed better when no one is present.  Reviewed breast feeding and latching basics.  Offered to observe her latching and to give her helpful advice if needed.  Mother interested in having my input.  Asked her to demonstrate hand expression and a few drops noted.  Finger fed drops to baby "Katelyn Mcguire." Observed mother attempting to latch with a little difficulty.  Asked if I could assist with holding and provided better support.  Mother somewhat timid when latching and did not obtain a deep latch.  With assistance, she was able to latch deeply.  Observed "Katelyn Mcguire" feeding with a wide gape and flanged lips; intermittent swallows noted after 10 minutes.  Continued education while observing for 20 minutes.  He continued to stay awake and actively suck.  Praised mother for her persistence and reassured her that they will both learn more every day.  Mother more relieved after consult.  Allowed her to complete the feeding and discussed how to make an OP LC visit if desired.  Mother has a DEBP for home use.  RN updated.   Maternal Data Has patient been taught Hand Expression?: Yes Does the patient have breastfeeding experience prior to this delivery?: No  Feeding Mother's Current Feeding Choice: Breast Milk  LATCH Score Latch: Grasps breast easily, tongue down, lips flanged, rhythmical sucking.  Audible Swallowing: A few with stimulation  Type  of Nipple: Everted at rest and after stimulation  Comfort (Breast/Nipple): Soft / non-tender  Hold (Positioning): Assistance needed to correctly position infant at breast and maintain latch.  LATCH Score: 8   Lactation Tools Discussed/Used Tools: Shells;Pump;Flanges Flange Size: 24 Breast pump type: Manual Reason for Pumping: Breast stimulation Pumping frequency: PRN  Interventions Interventions: Breast feeding basics reviewed;Assisted with latch;Skin to skin;Breast massage;Hand express;Breast compression;Hand pump;Shells;Expressed milk;Position options;Support pillows;Adjust position;Education  Discharge Discharge Education: Engorgement and breast care;Outpatient recommendation (OP as needed) Pump: Personal;Manual WIC Program: No  Consult Status Consult Status: Complete Date: 03/10/21 Follow-up type: Call as needed    Sharla Tankard R Dontarious Schaum 03/10/2021, 9:09 AM

## 2021-03-10 NOTE — Progress Notes (Signed)
PPD #2 No problems, a little sore Afeb, VSS D/c home

## 2021-03-10 NOTE — Progress Notes (Signed)
CSW met with MOB to complete consult for mental health concerns. CSW observed MOB resting in bed, and FOB on couch bonding with infant. MOB gave CSW verbal consent to complete consult while FOB was present. CSW explained role, and reason for consult. MOB was pleasant, and polite during engagement with CSW. MOB reported, history of anxiety her entire life, but it did not reach its peak until 2019. MOB reported, at that time, she was working, and attending beauty school. MOB reported, she has a history of over thinking. MOB denied, any history of psychotropic medication. CSW encourage MOB to implement healthy coping skills when symptoms arises.   CSW provided education regarding the baby blues period vs. perinatal mood disorders, discussed treatment and gave resources for mental health follow up if concerns arise. CSW recommends self- evaluation during the postpartum time period using the New Mom Checklist from Postpartum Progress and encouraged MOB to contact a medical professional if symptoms are noted at any time.   MOB reported, since delivery she feels, "good".  MOB reported, this morning she was a little emotional while breast feeding. MOB reported, she was overwhelm by the different breast feeding techniques. MOB reported, FOB, and her friends are very supportive. MOB denied SI, and HI when CSW assessed for safety.   MOB reported, there are no barriers to follow up infant's care. MOB reported, she has all essentials needed to care for infant. MOB reported, infant has a car seat, and bassinet. MOB denied any additional barriers.     CSW provided education on sudden infant death syndrome (SIDS).  CSW provided perinatal mood disorders resources.   CSW identifies no further need for intervention or barriers to discharge at this time.  Shamia Knight, MSW, LCSW-A Clinical Social Worker- Weekends [phone]  

## 2021-03-10 NOTE — Discharge Summary (Signed)
Postpartum Discharge Summary      Patient Name: Katelyn Mcguire DOB: 28-Sep-2000 MRN: [id number]  Date of admission: 03/07/2021 Delivery date:03/08/2021  Delivering provider: Marlow Baars  Date of discharge: 03/10/2021  Admitting diagnosis: [redacted] weeks gestation of pregnancy [Z3A.40] Intrauterine pregnancy: [redacted]w[redacted]d     Secondary diagnosis:  Active Problems:   [redacted] weeks gestation of pregnancy     Discharge diagnosis: Term Pregnancy Delivered, Gestational Hypertension, and Triple I                                                Hospital course: Onset of Labor With Vaginal Delivery      20 y.o. yo G1P1001 at [redacted]w[redacted]d was admitted in Latent Labor on 03/07/2021. Patient had a labor course as follows:  Membrane Rupture Time/Date: 7:41 PM ,03/07/2021   Delivery Method:Vaginal, Spontaneous  Episiotomy: None  Lacerations:  Vaginal  She was treated with Amp and Gent in labor for Triple I. Patient had an uncomplicated postpartum course.  She is ambulating, tolerating a regular diet, passing flatus, and urinating well. Patient is discharged home in stable condition on 03/10/21.  Newborn Data: Birth date:03/08/2021  Birth time:12:51 PM  Gender:Female  Living status:Living  Apgars:7 ,8  Weight:3270 g   Physical exam  Vitals:   03/09/21 0505 03/09/21 1317 03/09/21 2200 03/10/21 0523  BP: 117/79 123/83 123/84 123/90  Pulse: 81 93 84 69  Resp: 18 18 16 17   Temp: 98.6 F (37 C) 98.4 F (36.9 C) 98.2 F (36.8 C) 98.2 F (36.8 C)  TempSrc: Oral Oral Oral   SpO2: 100% 100% 100% 98%  Weight:      Height:       General: alert Lochia: appropriate Uterine Fundus: firm  Labs: Lab Results  Component Value Date   WBC 24.6 (H) 03/09/2021   HGB 12.5 03/09/2021   HCT 36.6 03/09/2021   MCV 85.9 03/09/2021   PLT 165 03/09/2021   CMP Latest Ref Rng & Units 03/07/2021  Glucose 70 - 99 mg/dL 98  BUN 6 - 20 mg/dL 7  Creatinine 03/09/2021 - 9.83 mg/dL 3.82  Sodium 5.05 - 397 mmol/L 136  Potassium 3.5 - 5.1  mmol/L 3.6  Chloride 98 - 111 mmol/L 108  CO2 22 - 32 mmol/L 16(L)  Calcium 8.9 - 10.3 mg/dL 673)  Total Protein 6.5 - 8.1 g/dL 6.0(L)  Total Bilirubin 0.3 - 1.2 mg/dL 0.9  Alkaline Phos 38 - 126 U/L 116  AST 15 - 41 U/L 18  ALT 0 - 44 U/L 11   Edinburgh Score: Edinburgh Postnatal Depression Scale Screening Tool 03/08/2021  I have been able to laugh and see the funny side of things. 0  I have looked forward with enjoyment to things. 0  I have blamed myself unnecessarily when things went wrong. 1  I have been anxious or worried for no good reason. 2  I have felt scared or panicky for no good reason. 0  Things have been getting on top of me. 1  I have been so unhappy that I have had difficulty sleeping. 1  I have felt sad or miserable. 1  I have been so unhappy that I have been crying. 1  The thought of harming myself has occurred to me. 0  Edinburgh Postnatal Depression Scale Total 7      After visit  meds:  Allergies as of 03/10/2021   No Known Allergies      Medication List     TAKE these medications    ibuprofen 600 MG tablet Commonly known as: ADVIL Take 1 tablet (600 mg total) by mouth every 6 (six) hours.   prenatal multivitamin Tabs tablet Take 1 tablet by mouth daily at 12 noon.         Discharge home in stable condition Infant Feeding: Breast Infant Disposition:home with mother Discharge instruction: per After Visit Summary and Postpartum booklet. Activity: Advance as tolerated. Pelvic rest for 6 weeks.  Diet: routine diet Postpartum Appointment:4 weeks Follow up Visit:  Follow-up Information     Marlow Baars, MD Follow up in 4 week(s).   Specialty: Obstetrics Contact information: [address] [place] [address] [phone]                     03/10/2021 Zenaida Niece, MD

## 2021-03-10 NOTE — Progress Notes (Addendum)
   03/10/21 0523 03/10/21 0700 03/10/21 1005  Vital Signs  BP 123/90  (could not measure. dinamap wasn't working) 127/84  Pulse Rate 69  --  69  Resp 17  --   --   Temp 98.2 F (36.8 C)  --   --   Delay on vitals follow up: RN attempted to find another dinamap and in the process got called into multiple other rooms.  Next bp 124/88. Dr Jackelyn Knife notified of diastolics  over 85. MD stated pt was ok to go home, no further intervention

## 2021-03-19 ENCOUNTER — Telehealth (HOSPITAL_COMMUNITY): Payer: Self-pay | Admitting: *Deleted

## 2021-03-19 NOTE — Telephone Encounter (Signed)
Attempted Hospital Discharge Follow-Up Call.  Left voice mail requesting that patient return RN's phone call.  

## 2023-01-27 ENCOUNTER — Ambulatory Visit (INDEPENDENT_AMBULATORY_CARE_PROVIDER_SITE_OTHER): Payer: Medicaid Other | Admitting: Urology

## 2023-01-27 ENCOUNTER — Ambulatory Visit (HOSPITAL_BASED_OUTPATIENT_CLINIC_OR_DEPARTMENT_OTHER)
Admission: RE | Admit: 2023-01-27 | Discharge: 2023-01-27 | Disposition: A | Discharge status: Home / Self Care | Payer: Medicaid Other | Source: Ambulatory Visit | Attending: Urology | Admitting: Urology

## 2023-01-27 ENCOUNTER — Other Ambulatory Visit: Payer: Self-pay | Admitting: Urology

## 2023-01-27 ENCOUNTER — Encounter: Payer: Self-pay | Admitting: Urology

## 2023-01-27 ENCOUNTER — Ambulatory Visit: Payer: Self-pay | Admitting: Urology

## 2023-01-27 VITALS — BP 142/90 | HR 75 | Ht 64.0 in | Wt 143.0 lb

## 2023-01-27 DIAGNOSIS — N201 Calculus of ureter: Secondary | ICD-10-CM

## 2023-01-27 DIAGNOSIS — R8290 Unspecified abnormal findings in urine: Secondary | ICD-10-CM | POA: Diagnosis not present

## 2023-01-27 LAB — URINALYSIS, ROUTINE W REFLEX MICROSCOPIC
Bilirubin, UA: NEGATIVE
Glucose, UA: NEGATIVE
Nitrite, UA: NEGATIVE
Specific Gravity, UA: 1.025 (ref 1.005–1.030)
Urobilinogen, Ur: 2 mg/dL — ABNORMAL HIGH (ref 0.2–1.0)
pH, UA: 7 (ref 5.0–7.5)

## 2023-01-27 LAB — MICROSCOPIC EXAMINATION
Epithelial Cells (non renal): >10 /hpf — AB (ref 0–10)
RBC, Urine: >30 /hpf — AB (ref 0–2)

## 2023-01-27 MED ORDER — OXYCODONE HCL 5 MG PO TABS: 5.0000 mg | ORAL_TABLET | Freq: Four times a day (QID) | ORAL | 0 refills | Status: AC | PRN

## 2023-01-27 NOTE — Progress Notes (Signed)
Assessment: 1. Ureteral calculus; left   2. Abnormal urine findings     Plan: I personally reviewed the CT study from 01/22/23 with results as noted below. I reviewed the KUB from today.  A 4-5 mm calcification is seen in the left pelvis consistent with distal migration of the ureteral calculus. Urine culture sent today although contamination likely given presence of epithelial cells Diagnosis and treatment options for a ureteral calculus including spontaneous stone passage, shock wave lithotripsy, and ureteroscopic stone manipulation were discussed with Merla Riches in detail.  She has elected to proceed with shockwave lithotripsy.  This will be scheduled for next Monday, 02/02/2023. Continue tamsulosin, pain medicine as needed. Strain urine  Procedure: The patient will be scheduled for left ESL at [facility].  Surgical request is placed with the surgery schedulers and will be scheduled at the patient's/family request. Informed consent is given as documented below. Anesthesia:  local  The patient does not have sleep apnea, history of MRSA, history of VRE, history of cardiac device requiring special anesthetic needs. Patient is stable and considered clear for surgical in an outpatient ambulatory surgery setting as well as patient hospital setting.  Consent for Operation or Procedure: Provider Certification I hereby certify that the nature, purpose, benefits, usual and most frequent risks of, and alternatives to, the operation or procedure have been explained to the patient (or person authorized to sign for the patient) either by me as responsible physician or by the provider who is to perform the operation or procedure. Time spent such that the patient/family has had an opportunity to ask questions, and that those questions have been answered. The patient or the patient's representative has been advised that selected tasks may be performed by assistants to the primary health  care provider(s). I believe that the patient (or person authorized to sign for the patient) understands what has been explained, and has consented to the operation or procedure. No guarantees were implied or made.   Chief Complaint:  Chief Complaint  Patient presents with   ureteral calculus    History of Present Illness:  Katelyn Mcguire is a 22 y.o. female who is seen for evaluation of a left ureteral calculus. She presented to [facility] emergency room on 01/22/2023 with acute onset of left lower quadrant pain with associated nausea and vomiting.  No fevers or chills. CT imaging showed bilateral nonobstructive renal calculi, mild to moderate left hydronephrosis with a 5 mm calculus in the proximal left ureter. She has been good with tamsulosin and pain medication.  Her pain has been fairly tolerable.  She is tolerating her diet.  She is having some issues with constipation.  No dysuria or gross hematuria.   Past Medical History:  Past Medical History:  Diagnosis Date   Gastritis     Past Surgical History:  Past Surgical History:  Procedure Laterality Date   ADENOIDECTOMY     TONSILLECTOMY     UPPER GASTROINTESTINAL ENDOSCOPY     WISDOM TOOTH EXTRACTION      Allergies:  No Known Allergies  Family History:  Family History  Problem Relation Age of Onset   Breast cancer Maternal Grandmother     Social History:  Social History   Tobacco Use   Smoking status: Never   Smokeless tobacco: Never  Vaping Use   Vaping status: Never Used  Substance Use Topics   Alcohol use: Never   Drug use: Never    Review of symptoms:  Constitutional:  Negative for unexplained weight loss, night sweats, fever, chills ENT:  Negative for nose bleeds, sinus pain, painful swallowing CV:  Negative for chest pain, shortness of breath, exercise intolerance, palpitations, loss of consciousness Resp:  Negative for cough, wheezing, shortness of breath GI:  Negative for diarrhea, bloody  stools GU:  Positives noted in HPI; otherwise negative for gross hematuria, dysuria, urinary incontinence Neuro:  Negative for seizures, poor balance, limb weakness, slurred speech Psych:  Negative for lack of energy, depression, anxiety Endocrine:  Negative for polydipsia, polyuria, symptoms of hypoglycemia (dizziness, hunger, sweating) Hematologic:  Negative for anemia, purpura, petechia, prolonged or excessive bleeding, use of anticoagulants  Allergic:  Negative for difficulty breathing or choking as a result of exposure to anything; no shellfish allergy; no allergic response (rash/itch) to materials, foods  Physical exam: BP (!) 142/90   Pulse 75   Ht 5\' 4"  (1.626 m)   Wt 143 lb (64.9 kg)   LMP 01/12/2023   BMI 24.55 kg/m  GENERAL APPEARANCE:  Well appearing, well developed, well nourished, NAD HEENT: Atraumatic, Normocephalic, oropharynx clear. NECK: Supple without lymphadenopathy or thyromegaly. LUNGS: Clear to auscultation bilaterally. HEART: Regular Rate and Rhythm without murmurs, gallops, or rubs. ABDOMEN: Soft, non-tender, No Masses. EXTREMITIES: Moves all extremities well.  Without clubbing, cyanosis, or edema. NEUROLOGIC:  Alert and oriented x 3, normal gait, CN II-XII grossly intact.  MENTAL STATUS:  Appropriate. BACK:  Non-tender to palpation.  No CVAT SKIN:  Warm, dry and intact.    Results: U/A:11-30 WBC, >30 RBC, >10 epithelial cells, many bacteria, nitrite negative

## 2023-01-29 LAB — URINE CULTURE

## 2023-01-30 ENCOUNTER — Telehealth: Payer: Self-pay | Admitting: Urology

## 2023-01-30 NOTE — Telephone Encounter (Signed)
Famotidine - pt is still breast feeding, and it says on bottle if you are breast feeding talk with doctor. Should she take the medication?

## 2023-01-30 NOTE — Progress Notes (Signed)
Preop phone call completed. Katelyn Mcguire or her father will be here [employer]. Nothing to eat after midnight. Clear liquids until 10 AM. Wear comfortable clothes. Stop vitamins and ibuprofen Friday, August 23,2024. May take pepcid if needed.

## 2023-02-02 ENCOUNTER — Encounter (HOSPITAL_BASED_OUTPATIENT_CLINIC_OR_DEPARTMENT_OTHER): Admission: RE | Payer: Self-pay | Source: Home / Self Care

## 2023-02-02 ENCOUNTER — Ambulatory Visit (HOSPITAL_BASED_OUTPATIENT_CLINIC_OR_DEPARTMENT_OTHER): Admission: RE | Admit: 2023-02-02 | Payer: Medicaid Other | Source: Home / Self Care | Admitting: Urology

## 2023-02-02 DIAGNOSIS — Z01818 Encounter for other preprocedural examination: Secondary | ICD-10-CM

## 2023-02-02 SURGERY — LITHOTRIPSY, ESWL
Anesthesia: LOCAL | Laterality: Left

## 2023-02-02 NOTE — Telephone Encounter (Signed)
Pt aware to discuss with PCP or pediatrician if she is concerned. Pt verbalized understanding.
